# Patient Record
Sex: Female | Born: 1995 | Race: Black or African American | Hispanic: No | Marital: Single | Smoking: Never smoker
Health system: Southern US, Community
[De-identification: ages and names within clinical notes are randomized; demographics above are authoritative.]

## PROBLEM LIST (undated history)

## (undated) DIAGNOSIS — J45909 Unspecified asthma, uncomplicated: Secondary | ICD-10-CM

---

## 2019-03-20 ENCOUNTER — Emergency Department (HOSPITAL_COMMUNITY)
Admission: EM | Admit: 2019-03-20 | Discharge: 2019-03-20 | Disposition: A | Discharge status: Home / Self Care | Payer: BLUE CROSS/BLUE SHIELD | Attending: Emergency Medicine | Admitting: Emergency Medicine

## 2019-03-20 ENCOUNTER — Encounter (HOSPITAL_COMMUNITY): Payer: Self-pay | Admitting: Emergency Medicine

## 2019-03-20 ENCOUNTER — Other Ambulatory Visit: Payer: Self-pay

## 2019-03-20 DIAGNOSIS — J029 Acute pharyngitis, unspecified: Secondary | ICD-10-CM | POA: Diagnosis not present

## 2019-03-20 DIAGNOSIS — J45909 Unspecified asthma, uncomplicated: Secondary | ICD-10-CM | POA: Diagnosis not present

## 2019-03-20 HISTORY — DX: Unspecified asthma, uncomplicated: J45.909

## 2019-03-20 LAB — GROUP A STREP BY PCR: Group A Strep by PCR: NOT DETECTED

## 2019-03-20 NOTE — ED Provider Notes (Signed)
[facility] DEPT Provider Note   CSN: [id number] Arrival date & time: 03/20/19  2126     History   Chief Complaint Chief Complaint  Patient presents with  . Sore Throat    HPI Monica Livingston is a 23 y.o. female.     Patient reports sore throat and malaise since Monday. Today she noted some blood in the expectorant when she cleared her throat. No fevers or chills. Some mild muscle aches. No difficulty swallowing. She is a Ship broker at [employer]. No known sick contacts.  The history is provided by the patient.  Sore Throat This is a new problem. The current episode started more than 2 days ago. The problem has not changed since onset.Pertinent negatives include no abdominal pain.    Past Medical History:  Diagnosis Date  . Asthma     There are no active problems to display for this patient.   History reviewed. No pertinent surgical history.   OB History   No obstetric history on file.      Home Medications    Prior to Admission medications   Not on File    Family History History reviewed. No pertinent family history.  Social History Social History   Tobacco Use  . Smoking status: Never Smoker  . Smokeless tobacco: Never Used  Substance Use Topics  . Alcohol use: Never    Frequency: Never  . Drug use: Never     Allergies   Patient has no known allergies.   Review of Systems Review of Systems  HENT: Positive for sore throat. Negative for trouble swallowing and voice change.   Respiratory: Negative for cough.   Gastrointestinal: Negative for abdominal pain, diarrhea, nausea and vomiting.  Musculoskeletal: Positive for myalgias.  All other systems reviewed and are negative.    Physical Exam Updated Vital Signs BP (!) 160/113 (BP Location: Left Arm)   Pulse 64   Temp 98.5 F (36.9 C) (Oral)   Resp 16   Ht 5\' 4"  (1.626 m)   Wt 86.2 kg   LMP 03/05/2018   SpO2 99%   BMI 32.61 kg/m   Physical Exam Vitals signs  and nursing note reviewed.  Constitutional:      Appearance: She is well-developed. She is not ill-appearing.  HENT:     Mouth/Throat:     Mouth: Mucous membranes are moist.     Pharynx: Posterior oropharyngeal erythema present. No oropharyngeal exudate.  Neck:     Musculoskeletal: Normal range of motion and neck supple.  Cardiovascular:     Rate and Rhythm: Normal rate and regular rhythm.  Pulmonary:     Effort: Pulmonary effort is normal.     Breath sounds: Normal breath sounds.  Abdominal:     General: Bowel sounds are normal.     Palpations: Abdomen is soft.  Lymphadenopathy:     Cervical: No cervical adenopathy.  Skin:    General: Skin is warm and dry.     Findings: No rash.  Neurological:     Mental Status: She is alert and oriented to person, place, and time.  Psychiatric:        Mood and Affect: Mood normal.      ED Treatments / Results  Labs (all labs ordered are listed, but only abnormal results are displayed) Labs Reviewed  GROUP A STREP BY PCR    EKG None  Radiology No results found.  Procedures Procedures (including critical care time)  Medications Ordered in ED Medications -  No data to display   Initial Impression / Assessment and Plan / ED Course  I have reviewed the triage vital signs and the nursing notes.  Pertinent labs & imaging results that were available during my care of the patient were reviewed by me and considered in my medical decision making (see chart for details).        Pt with negative strep. Diagnosis of viral pharyngitis. No abx indicated at this time. Marland Kitchen Discharge with symptomatic tx. No evidence of dehydration. Pt is tolerating secretions. Presentation not concerning for peritonsillar abscess or spread of infection to deep spaces of the throat; patent airway. Specific return precautions discussed. Recommended PCP follow up. Pt appears safe for discharge.  Final Clinical Impressions(s) / ED Diagnoses   Final diagnoses:   Viral pharyngitis    ED Discharge Orders    None       Felicie Morn, NP 03/20/19 5732    Bethann Berkshire, MD 03/21/19 2303

## 2019-03-20 NOTE — ED Triage Notes (Signed)
Patient complaining of a sore throat since Monday nov.9,2020. Patient states she has has a little bit of blood in her sputum. Patient is having some pain in her throat.

## 2019-07-17 ENCOUNTER — Ambulatory Visit: Payer: BLUE CROSS/BLUE SHIELD | Attending: Family

## 2019-07-17 DIAGNOSIS — Z23 Encounter for immunization: Secondary | ICD-10-CM

## 2019-07-17 NOTE — Progress Notes (Signed)
   Covid-19 Vaccination Clinic  Name:  Monica Livingston    MRN: [id number] DOB: [birth year]  07/17/2019  [name] was observed post Covid-19 immunization for 15 minutes without incident. She was provided with Vaccine Information Sheet and instruction to access the V-Safe system.   [name] was instructed to call 911 with any severe reactions post vaccine: Marland Kitchen Difficulty breathing  . Swelling of face and throat  . A fast heartbeat  . A bad rash all over body  . Dizziness and weakness   Immunizations Administered    Name Date Dose VIS Date Route   Moderna COVID-19 Vaccine 07/17/2019  2:18 PM 0.5 mL 04/08/2019 Intramuscular   Manufacturer: Moderna   Lot: [id number]   NDC: 92426-834-19

## 2019-08-19 ENCOUNTER — Ambulatory Visit: Payer: Self-pay | Attending: Family

## 2019-08-19 DIAGNOSIS — Z23 Encounter for immunization: Secondary | ICD-10-CM

## 2019-08-19 NOTE — Progress Notes (Signed)
   Covid-19 Vaccination Clinic  Name:  Monica Livingston    MRN: [id number] DOB: 1996-04-08  08/19/2019  [name] was observed post Covid-19 immunization for 15 minutes without incident. She was provided with Vaccine Information Sheet and instruction to access the V-Safe system.   [name] was instructed to call 911 with any severe reactions post vaccine: Marland Kitchen Difficulty breathing  . Swelling of face and throat  . A fast heartbeat  . A bad rash all over body  . Dizziness and weakness   Immunizations Administered    Name Date Dose VIS Date Route   Moderna COVID-19 Vaccine 08/19/2019 11:11 AM 0.5 mL 04/08/2019 Intramuscular   Manufacturer: Moderna   Lot: [id number]   NDC: 28979-150-41

## 2020-03-11 ENCOUNTER — Ambulatory Visit: Payer: BC Managed Care – PPO | Attending: Family

## 2020-03-11 DIAGNOSIS — Z23 Encounter for immunization: Secondary | ICD-10-CM

## 2020-05-21 ENCOUNTER — Encounter (HOSPITAL_COMMUNITY): Payer: Self-pay | Admitting: *Deleted

## 2020-05-21 ENCOUNTER — Other Ambulatory Visit: Payer: Self-pay

## 2020-05-21 ENCOUNTER — Emergency Department (HOSPITAL_COMMUNITY)
Admission: EM | Admit: 2020-05-21 | Discharge: 2020-05-22 | Disposition: A | Discharge status: Home / Self Care | Payer: BC Managed Care – PPO | Attending: Emergency Medicine | Admitting: Emergency Medicine

## 2020-05-21 DIAGNOSIS — Z5321 Procedure and treatment not carried out due to patient leaving prior to being seen by health care provider: Secondary | ICD-10-CM | POA: Insufficient documentation

## 2020-05-21 DIAGNOSIS — Y92410 Unspecified street and highway as the place of occurrence of the external cause: Secondary | ICD-10-CM | POA: Insufficient documentation

## 2020-05-21 DIAGNOSIS — M549 Dorsalgia, unspecified: Secondary | ICD-10-CM | POA: Insufficient documentation

## 2020-05-21 DIAGNOSIS — R519 Headache, unspecified: Secondary | ICD-10-CM | POA: Insufficient documentation
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
## 2020-05-22 NOTE — ED Notes (Signed)
Pt called for VS multiple times, no response.

## 2020-05-25 NOTE — Progress Notes (Signed)
° °  Covid-19 Vaccination Clinic  Name:  Monica Livingston    MRN: [id number] DOB: June 03, 1995  05/25/2020  [name] was observed post Covid-19 immunization for 15 minutes without incident. She was provided with Vaccine Information Sheet and instruction to access the V-Safe system.   Ms. Buckle was instructed to call 911 with any severe reactions post vaccine:  Difficulty breathing   Swelling of face and throat   A fast heartbeat   A bad rash all over body   Dizziness and weakness   Immunizations Administered    Name Date Dose VIS Date Route   Moderna Covid-19 Booster Vaccine 03/11/2020  2:50 PM 0.25 mL 02/25/2020 Intramuscular   Manufacturer: Moderna   Lot: [id number]   NDC: 50539-767-34

## 2020-05-28 ENCOUNTER — Emergency Department (HOSPITAL_COMMUNITY): Payer: BC Managed Care – PPO

## 2020-05-28 ENCOUNTER — Encounter (HOSPITAL_COMMUNITY): Payer: Self-pay

## 2020-05-28 ENCOUNTER — Emergency Department (HOSPITAL_COMMUNITY)
Admission: EM | Admit: 2020-05-28 | Discharge: 2020-05-28 | Disposition: A | Discharge status: Home / Self Care | Payer: BC Managed Care – PPO | Attending: Emergency Medicine | Admitting: Emergency Medicine

## 2020-05-28 ENCOUNTER — Other Ambulatory Visit: Payer: Self-pay

## 2020-05-28 DIAGNOSIS — R079 Chest pain, unspecified: Secondary | ICD-10-CM | POA: Insufficient documentation

## 2020-05-28 DIAGNOSIS — M546 Pain in thoracic spine: Secondary | ICD-10-CM | POA: Diagnosis not present

## 2020-05-28 DIAGNOSIS — S161XXA Strain of muscle, fascia and tendon at neck level, initial encounter: Secondary | ICD-10-CM

## 2020-05-28 DIAGNOSIS — Y92410 Unspecified street and highway as the place of occurrence of the external cause: Secondary | ICD-10-CM | POA: Diagnosis not present

## 2020-05-28 DIAGNOSIS — M542 Cervicalgia: Secondary | ICD-10-CM | POA: Diagnosis not present

## 2020-05-28 DIAGNOSIS — J45909 Unspecified asthma, uncomplicated: Secondary | ICD-10-CM | POA: Insufficient documentation

## 2020-05-28 MED ORDER — CYCLOBENZAPRINE HCL 10 MG PO TABS: 10.0000 mg | ORAL_TABLET | Freq: Every day | ORAL | 0 refills | Status: AC
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
## 2020-05-28 NOTE — Discharge Instructions (Signed)
I recommend a combination of tylenol and ibuprofen for management of your pain. You can take a low dose of both at the same time. I recommend 325 mg of Tylenol combined with 400 mg of ibuprofen. This is one regular Tylenol and two regular ibuprofen. You can take these 2-3 times for day for your pain. Please try to take these medications with a small amount of food as well to prevent upsetting your stomach.  Also, please consider topical pain relieving creams such as Voltaran Gel, BioFreeze, or Icy Hot. There is also a pain relieving cream made by [employer]. You should be able to find all of these at your local pharmacy.   I am prescribing you a strong muscle relaxer called flexeril. Please only take this medication once in the evening with dinner. This medication can make you quite drowsy. Do not mix it with alcohol. Do not drive a vehicle after taking it.   Please return to the ER with any new or worsening symptoms.  It was a pleasure to meet you. 
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
## 2021-10-22 ENCOUNTER — Encounter (HOSPITAL_COMMUNITY): Payer: Self-pay | Admitting: Emergency Medicine

## 2021-10-22 ENCOUNTER — Other Ambulatory Visit: Payer: Self-pay

## 2021-10-22 ENCOUNTER — Emergency Department (HOSPITAL_COMMUNITY)
Admission: EM | Admit: 2021-10-22 | Discharge: 2021-10-23 | Disposition: A | Discharge status: Home / Self Care | Payer: BC Managed Care – PPO | Attending: Emergency Medicine | Admitting: Emergency Medicine

## 2021-10-22 DIAGNOSIS — R110 Nausea: Secondary | ICD-10-CM | POA: Insufficient documentation

## 2021-10-22 DIAGNOSIS — R42 Dizziness and giddiness: Secondary | ICD-10-CM | POA: Insufficient documentation

## 2021-10-22 DIAGNOSIS — Z5321 Procedure and treatment not carried out due to patient leaving prior to being seen by health care provider: Secondary | ICD-10-CM | POA: Diagnosis not present

## 2021-10-22 NOTE — ED Triage Notes (Signed)
Patient arrived with EMS from home reports feeling lightheaded/headache with dizziness and nausea / eyes burning after mixing Clorox and [employer]ul[employer] cleaning solution this evening . Respirations unlabored/alert and oriented.

## 2021-10-23 ENCOUNTER — Emergency Department (HOSPITAL_COMMUNITY): Payer: BC Managed Care – PPO

## 2021-10-23 LAB — CBC WITH DIFFERENTIAL/PLATELET
Abs Immature Granulocytes: 0.01 10*3/uL (ref 0.00–0.07)
Basophils Absolute: 0 10*3/uL (ref 0.0–0.1)
Basophils Relative: 1 %
Eosinophils Absolute: 0.1 10*3/uL (ref 0.0–0.5)
Eosinophils Relative: 2 %
HCT: 45.6 % (ref 36.0–46.0)
Hemoglobin: 14 g/dL (ref 12.0–15.0)
Immature Granulocytes: 0 %
Lymphocytes Relative: 45 %
Lymphs Abs: 2.8 10*3/uL (ref 0.7–4.0)
MCH: 30 pg (ref 26.0–34.0)
MCHC: 30.7 g/dL (ref 30.0–36.0)
MCV: 97.9 fL (ref 80.0–100.0)
Monocytes Absolute: 0.4 10*3/uL (ref 0.1–1.0)
Monocytes Relative: 7 %
Neutro Abs: 2.8 10*3/uL (ref 1.7–7.7)
Neutrophils Relative %: 45 %
Platelets: 282 10*3/uL (ref 150–400)
RBC: 4.66 MIL/uL (ref 3.87–5.11)
RDW: 13.9 % (ref 11.5–15.5)
WBC: 6.2 10*3/uL (ref 4.0–10.5)
nRBC: 0 % (ref 0.0–0.2)

## 2021-10-23 LAB — BASIC METABOLIC PANEL
Anion gap: 12 (ref 5–15)
BUN: 10 mg/dL (ref 6–20)
CO2: 19 mmol/L — ABNORMAL LOW (ref 22–32)
Calcium: 9.4 mg/dL (ref 8.9–10.3)
Chloride: 109 mmol/L (ref 98–111)
Creatinine, Ser: 0.81 mg/dL (ref 0.44–1.00)
GFR, Estimated: >60 mL/min (ref >60)
Glucose, Bld: 84 mg/dL (ref 70–99)
Potassium: 3 mmol/L — ABNORMAL LOW (ref 3.5–5.1)
Sodium: 140 mmol/L (ref 135–145)

## 2021-10-23 LAB — I-STAT BETA HCG BLOOD, ED (MC, WL, AP ONLY): I-stat hCG, quantitative: <5 m[IU]/mL (ref <5)

## 2021-10-23 NOTE — ED Notes (Signed)
Pt left due to not being seen quick enough 
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
## 2024-05-09 ENCOUNTER — Emergency Department (HOSPITAL_BASED_OUTPATIENT_CLINIC_OR_DEPARTMENT_OTHER): Admitting: Radiology

## 2024-05-09 ENCOUNTER — Other Ambulatory Visit: Payer: Self-pay

## 2024-05-09 ENCOUNTER — Encounter (HOSPITAL_BASED_OUTPATIENT_CLINIC_OR_DEPARTMENT_OTHER): Payer: Self-pay | Admitting: Emergency Medicine

## 2024-05-09 DIAGNOSIS — J45909 Unspecified asthma, uncomplicated: Secondary | ICD-10-CM | POA: Diagnosis not present

## 2024-05-09 DIAGNOSIS — J069 Acute upper respiratory infection, unspecified: Secondary | ICD-10-CM | POA: Insufficient documentation

## 2024-05-09 DIAGNOSIS — E876 Hypokalemia: Secondary | ICD-10-CM | POA: Diagnosis not present

## 2024-05-09 DIAGNOSIS — R0789 Other chest pain: Secondary | ICD-10-CM | POA: Diagnosis present

## 2024-05-09 LAB — CBC
HCT: 38.2 % (ref 36.0–46.0)
Hemoglobin: 12.8 g/dL (ref 12.0–15.0)
MCH: 29.9 pg (ref 26.0–34.0)
MCHC: 33.5 g/dL (ref 30.0–36.0)
MCV: 89.3 fL (ref 80.0–100.0)
Platelets: 282 K/uL (ref 150–400)
RBC: 4.28 MIL/uL (ref 3.87–5.11)
RDW: 13.3 % (ref 11.5–15.5)
WBC: 10 K/uL (ref 4.0–10.5)
nRBC: 0 % (ref 0.0–0.2)

## 2024-05-09 LAB — BASIC METABOLIC PANEL WITH GFR
Anion gap: 12 (ref 5–15)
BUN: 10 mg/dL (ref 6–20)
CO2: 25 mmol/L (ref 22–32)
Calcium: 9.6 mg/dL (ref 8.9–10.3)
Chloride: 105 mmol/L (ref 98–111)
Creatinine, Ser: 0.67 mg/dL (ref 0.44–1.00)
GFR, Estimated: >60 mL/min
Glucose, Bld: 122 mg/dL — ABNORMAL HIGH (ref 70–99)
Potassium: 3.4 mmol/L — ABNORMAL LOW (ref 3.5–5.1)
Sodium: 142 mmol/L (ref 135–145)

## 2024-05-09 LAB — TROPONIN T, HIGH SENSITIVITY: Troponin T High Sensitivity: <15 ng/L (ref 0–19)

## 2024-05-09 LAB — PREGNANCY, URINE: Preg Test, Ur: NEGATIVE

## 2024-05-09 NOTE — ED Triage Notes (Signed)
 Patient c/o chest pain and sob x 2 days.  Patient sent here from [facility] d/t elevated d-dimer.

## 2024-05-10 ENCOUNTER — Emergency Department (HOSPITAL_BASED_OUTPATIENT_CLINIC_OR_DEPARTMENT_OTHER)

## 2024-05-10 ENCOUNTER — Emergency Department (HOSPITAL_BASED_OUTPATIENT_CLINIC_OR_DEPARTMENT_OTHER)
Admission: EM | Admit: 2024-05-10 | Discharge: 2024-05-10 | Disposition: A | Discharge status: Home / Self Care | Attending: Emergency Medicine | Admitting: Emergency Medicine

## 2024-05-10 DIAGNOSIS — R0789 Other chest pain: Secondary | ICD-10-CM

## 2024-05-10 DIAGNOSIS — J069 Acute upper respiratory infection, unspecified: Secondary | ICD-10-CM

## 2024-05-10 MED ORDER — IOHEXOL 350 MG/ML SOLN
100.0000 mL | Freq: Once | INTRAVENOUS | Status: AC | PRN
  Administered 2024-05-10: 75 mL via INTRAVENOUS

## 2024-05-10 NOTE — ED Provider Notes (Signed)
 " [place] EMERGENCY DEPARTMENT AT [facility] Provider Note   CSN: [id number] Arrival date & time: 05/09/24  2255     Patient presents with: No chief complaint on file.   Monica Livingston is a 29 y.o. female.   HPI     This is a 29 year old female who presents with concern for chest discomfort.  Was seen and evaluated urgent care.  Reports several day history of upper respiratory symptoms, cough, congestion.  Tested negative for viral illness including influenza.  She has had some chest tightness during that time.  She was screened with a D-dimer and this was positive.  She has no history of blood clots.  No lower extremity swelling.  Prior to Admission medications  Medication Sig Start Date End Date Taking? Authorizing Provider  cyclobenzaprine  (FLEXERIL ) 10 MG tablet Take 1 tablet (10 mg total) by mouth at bedtime. 05/28/20   Joldersma, Logan, PA-C    Allergies: Patient has no known allergies.    Review of Systems  Constitutional:  Negative for fever.  HENT:  Positive for congestion.   Respiratory:  Positive for cough, chest tightness and shortness of breath.   Cardiovascular:  Negative for chest pain and leg swelling.  Gastrointestinal:  Negative for abdominal pain.  All other systems reviewed and are negative.   Updated Vital Signs BP (!) 158/87   Pulse 95   Temp 98.5 F (36.9 C)   Resp 16   Wt 108.9 kg   SpO2 100%   BMI 41.20 kg/m   Physical Exam Vitals and nursing note reviewed.  Constitutional:      Appearance: She is well-developed. She is obese. She is not ill-appearing.  HENT:     Head: Normocephalic and atraumatic.     Nose: Congestion present.     Mouth/Throat:     Mouth: Mucous membranes are moist.  Eyes:     Pupils: Pupils are equal, round, and reactive to light.  Cardiovascular:     Rate and Rhythm: Normal rate and regular rhythm.     Heart sounds: Normal heart sounds.  Pulmonary:     Effort: Pulmonary effort is normal. No respiratory  distress.     Breath sounds: No wheezing.  Abdominal:     Palpations: Abdomen is soft.     Tenderness: There is no abdominal tenderness.  Musculoskeletal:     Cervical back: Neck supple.  Skin:    General: Skin is warm and dry.  Neurological:     Mental Status: She is alert and oriented to person, place, and time.  Psychiatric:        Mood and Affect: Mood normal.     (all labs ordered are listed, but only abnormal results are displayed) Labs Reviewed  BASIC METABOLIC PANEL WITH GFR - Abnormal; Notable for the following components:      Result Value   Potassium 3.4 (*)    Glucose, Bld 122 (*)    All other components within normal limits  CBC  PREGNANCY, URINE  TROPONIN T, HIGH SENSITIVITY    EKG: EKG Interpretation Date/Time:  Friday May 09 2024 23:08:58 EST Ventricular Rate:  85 PR Interval:  146 QRS Duration:  74 QT Interval:  358 QTC Calculation: 426 R Axis:   29  Text Interpretation: Normal sinus rhythm Nonspecific ST and T wave abnormality Abnormal ECG No previous ECGs available Confirmed by Bari Pfeiffer ([id number]) on 05/10/2024 1:46:44 AM  Radiology: CT Angio Chest PE W and/or Wo Contrast Result Date: 05/10/2024 EXAM:  CTA CHEST 05/10/2024 02:28:19 AM TECHNIQUE: CTA of the chest was performed after the administration of 75 mL of iohexol  (OMNIPAQUE ) 350 MG/ML injection. Multiplanar reformatted images are provided for review. MIP images are provided for review. Automated exposure control, iterative reconstruction, and/or weight based adjustment of the mA/kV was utilized to reduce the radiation dose to as low as reasonably achievable. COMPARISON: Chest x-ray 05/09/2024. CLINICAL HISTORY: Pulmonary embolism (PE) suspected, low to intermediate prob, positive D-dimer. FINDINGS: PULMONARY ARTERIES: Pulmonary arteries are adequately opacified for evaluation. No acute pulmonary embolus. Main pulmonary artery is normal in caliber. MEDIASTINUM: The heart and pericardium  demonstrate no acute abnormality. There is no acute abnormality of the thoracic aorta. LYMPH NODES: No mediastinal, hilar or axillary lymphadenopathy. LUNGS AND PLEURA: The lungs are without acute process. No focal consolidation or pulmonary edema. No evidence of pleural effusion or pneumothorax. UPPER ABDOMEN: Limited images of the upper abdomen are unremarkable. SOFT TISSUES AND BONES: No acute bone or soft tissue abnormality. IMPRESSION: 1. No pulmonary embolism. 2. No acute cardiopulmonary disease. Electronically signed by: Franky Crease MD 05/10/2024 02:37 AM EST RP Workstation: [id number]   DG Chest 2 View Result Date: 05/10/2024 EXAM: 2 VIEW(S) XRAY OF THE CHEST 05/09/2024 11:29:00 PM COMPARISON: 10/22/2021 CLINICAL HISTORY: chest pain/sob FINDINGS: LUNGS AND PLEURA: No focal pulmonary opacity. No pleural effusion. No pneumothorax. HEART AND MEDIASTINUM: No acute abnormality of the cardiac and mediastinal silhouettes. BONES AND SOFT TISSUES: No acute osseous abnormality. IMPRESSION: 1. No active cardiopulmonary disease. Electronically signed by: Franky Crease MD 05/10/2024 12:01 AM EST RP Workstation: [id number]     Procedures   Medications Ordered in the ED  iohexol  (OMNIPAQUE ) 350 MG/ML injection 100 mL (75 mLs Intravenous Contrast Given 05/10/24 0228)                                    Medical Decision Making Amount and/or Complexity of Data Reviewed Labs: ordered. Radiology: ordered.  Risk Prescription drug management.   This patient presents to the ED for concern of positive D-dimer, upper respiratory symptoms, this involves an extensive number of treatment options, and is a complaint that carries with it a high risk of complications and morbidity.  I considered the following differential and admission for this acute, potentially life threatening condition.  The differential diagnosis includes CS, PE, pneumothorax, pneumonia, viral illness such as COVID or influenza, asthma  attack  MDM:    This is a 29 year old female who presents with upper respiratory symptoms.  Will screen positive with a D-dimer urgent care.  She is nontoxic and vital signs are reassuring.  No active wheezing on exam.  Does have a history of asthma.  EKG shows no evidence of acute ischemia or arrhythmia.  Labs including troponin are negative.  Highly suspect her symptoms are related to acute viral illness given recent upper respiratory symptoms; however given positive D-dimer urgent care, will obtain CT angio.  CT angio obtained and negative.  Discussed with patient supportive measures for likely viral illness.  (Labs, imaging, consults)  Labs: I Ordered, and personally interpreted labs.  The pertinent results include: CBC, BMP, troponin x 2  Imaging Studies ordered: I ordered imaging studies including CT I independently visualized and interpreted imaging. I agree with the radiologist interpretation  Additional history obtained from chart review external records from outside source obtained and reviewed including urgent notes  Cardiac Monitoring: The patient was maintained on a cardiac monitor.  If on the cardiac monitor, I personally viewed and interpreted the cardiac monitored which showed an underlying rhythm of: Sinus  Reevaluation: After the interventions noted above, I reevaluated the patient and found that they have :stayed the same  Social Determinants of Health:  lives independently  Disposition: Discharge  Co morbidities that complicate the patient evaluation  Past Medical History:  Diagnosis Date   Asthma      Medicines Meds ordered this encounter  Medications   iohexol  (OMNIPAQUE ) 350 MG/ML injection 100 mL    I have reviewed the patients home medicines and have made adjustments as needed  Problem List / ED Course: Problem List Items Addressed This Visit   None Visit Diagnoses       Viral URI    -  Primary     Atypical chest pain                     Final diagnoses:  Viral URI  Atypical chest pain    ED Discharge Orders     None          Bari Charmaine FALCON, MD 05/10/24 0327  "

## 2024-05-10 NOTE — Discharge Instructions (Signed)
 You were seen today for a positive D-dimer.  Your CT imaging does not show any evidence of blood clot.  Your other workup is reassuring for chest discomfort.  Given your upper respiratory symptoms, this is likely related to acute viral illness.  Take Tylenol or ibuprofen for any body aches or pains.  Make sure that you are staying hydrated.
